# Patient Record
Sex: Female | Born: 1955 | Race: White | Hispanic: No | Marital: Married | Smoking: Never smoker
Health system: Southern US, Community
[De-identification: ages and names within clinical notes are randomized; demographics above are authoritative.]

## PROBLEM LIST (undated history)

## (undated) DIAGNOSIS — R112 Nausea with vomiting, unspecified: Secondary | ICD-10-CM

## (undated) DIAGNOSIS — T8859XA Other complications of anesthesia, initial encounter: Secondary | ICD-10-CM

## (undated) DIAGNOSIS — M8580 Other specified disorders of bone density and structure, unspecified site: Secondary | ICD-10-CM

## (undated) DIAGNOSIS — E7800 Pure hypercholesterolemia, unspecified: Secondary | ICD-10-CM

## (undated) DIAGNOSIS — M810 Age-related osteoporosis without current pathological fracture: Secondary | ICD-10-CM

## (undated) DIAGNOSIS — N390 Urinary tract infection, site not specified: Secondary | ICD-10-CM

## (undated) DIAGNOSIS — E559 Vitamin D deficiency, unspecified: Secondary | ICD-10-CM

## (undated) DIAGNOSIS — K635 Polyp of colon: Secondary | ICD-10-CM

## (undated) DIAGNOSIS — Z9889 Other specified postprocedural states: Secondary | ICD-10-CM

## (undated) DIAGNOSIS — N3010 Interstitial cystitis (chronic) without hematuria: Secondary | ICD-10-CM

## (undated) DIAGNOSIS — E538 Deficiency of other specified B group vitamins: Secondary | ICD-10-CM

## (undated) HISTORY — PX: TUBAL LIGATION: SHX77

## (undated) HISTORY — PX: OOPHORECTOMY: SHX86

## (undated) HISTORY — PX: ABDOMINAL HYSTERECTOMY: SHX81

---

## 2004-09-03 ENCOUNTER — Ambulatory Visit: Payer: Self-pay | Admitting: Unknown Physician Specialty

## 2005-06-09 ENCOUNTER — Ambulatory Visit: Payer: Self-pay | Admitting: Urology

## 2005-06-21 ENCOUNTER — Ambulatory Visit: Payer: Self-pay | Admitting: Urology

## 2005-09-10 ENCOUNTER — Ambulatory Visit: Payer: Self-pay | Admitting: Unknown Physician Specialty

## 2005-09-17 ENCOUNTER — Ambulatory Visit: Payer: Self-pay | Admitting: Unknown Physician Specialty

## 2006-02-10 ENCOUNTER — Ambulatory Visit: Payer: Self-pay | Admitting: Unknown Physician Specialty

## 2006-07-11 ENCOUNTER — Ambulatory Visit: Payer: Self-pay | Admitting: Unknown Physician Specialty

## 2006-09-13 ENCOUNTER — Ambulatory Visit: Payer: Self-pay | Admitting: Unknown Physician Specialty

## 2007-08-04 ENCOUNTER — Ambulatory Visit: Payer: Self-pay | Admitting: Unknown Physician Specialty

## 2007-08-08 ENCOUNTER — Inpatient Hospital Stay: Payer: Self-pay | Admitting: Unknown Physician Specialty

## 2007-10-11 ENCOUNTER — Ambulatory Visit: Payer: Self-pay | Admitting: Unknown Physician Specialty

## 2008-10-21 ENCOUNTER — Ambulatory Visit: Payer: Self-pay | Admitting: Unknown Physician Specialty

## 2009-10-22 ENCOUNTER — Ambulatory Visit: Payer: Self-pay | Admitting: Unknown Physician Specialty

## 2010-01-12 ENCOUNTER — Ambulatory Visit: Payer: Self-pay | Admitting: Unknown Physician Specialty

## 2010-11-13 ENCOUNTER — Ambulatory Visit: Payer: Self-pay | Admitting: Unknown Physician Specialty

## 2016-03-16 ENCOUNTER — Other Ambulatory Visit: Payer: Self-pay | Admitting: Obstetrics & Gynecology

## 2016-03-16 DIAGNOSIS — M810 Age-related osteoporosis without current pathological fracture: Secondary | ICD-10-CM | POA: Insufficient documentation

## 2016-03-16 DIAGNOSIS — Z1231 Encounter for screening mammogram for malignant neoplasm of breast: Secondary | ICD-10-CM

## 2016-03-23 ENCOUNTER — Encounter: Payer: Self-pay | Admitting: Radiology

## 2016-03-23 ENCOUNTER — Ambulatory Visit
Admission: RE | Admit: 2016-03-23 | Discharge: 2016-03-23 | Disposition: A | Discharge status: Home / Self Care | Payer: 59 | Source: Ambulatory Visit | Attending: Obstetrics & Gynecology | Admitting: Obstetrics & Gynecology

## 2016-03-23 DIAGNOSIS — Z1231 Encounter for screening mammogram for malignant neoplasm of breast: Secondary | ICD-10-CM | POA: Diagnosis not present

## 2017-03-17 ENCOUNTER — Other Ambulatory Visit: Payer: Self-pay | Admitting: Obstetrics & Gynecology

## 2017-03-17 DIAGNOSIS — Z1231 Encounter for screening mammogram for malignant neoplasm of breast: Secondary | ICD-10-CM

## 2017-03-30 ENCOUNTER — Ambulatory Visit
Admission: RE | Admit: 2017-03-30 | Discharge: 2017-03-30 | Disposition: A | Discharge status: Home / Self Care | Payer: 59 | Source: Ambulatory Visit | Attending: Obstetrics & Gynecology | Admitting: Obstetrics & Gynecology

## 2017-03-30 DIAGNOSIS — Z1231 Encounter for screening mammogram for malignant neoplasm of breast: Secondary | ICD-10-CM

## 2018-03-21 ENCOUNTER — Other Ambulatory Visit: Payer: Self-pay | Admitting: Obstetrics & Gynecology

## 2018-03-21 DIAGNOSIS — Z1231 Encounter for screening mammogram for malignant neoplasm of breast: Secondary | ICD-10-CM

## 2018-04-27 ENCOUNTER — Ambulatory Visit
Admission: RE | Admit: 2018-04-27 | Discharge: 2018-04-27 | Disposition: A | Discharge status: Home / Self Care | Payer: BLUE CROSS/BLUE SHIELD | Source: Ambulatory Visit | Attending: Obstetrics & Gynecology | Admitting: Obstetrics & Gynecology

## 2018-04-27 DIAGNOSIS — Z1231 Encounter for screening mammogram for malignant neoplasm of breast: Secondary | ICD-10-CM | POA: Insufficient documentation

## 2019-03-28 ENCOUNTER — Other Ambulatory Visit: Payer: Self-pay | Admitting: Obstetrics & Gynecology

## 2019-03-28 DIAGNOSIS — Z1231 Encounter for screening mammogram for malignant neoplasm of breast: Secondary | ICD-10-CM

## 2019-04-30 ENCOUNTER — Ambulatory Visit
Admission: RE | Admit: 2019-04-30 | Discharge: 2019-04-30 | Disposition: A | Discharge status: Home / Self Care | Payer: BC Managed Care – PPO | Source: Ambulatory Visit | Attending: Obstetrics & Gynecology | Admitting: Obstetrics & Gynecology

## 2019-04-30 DIAGNOSIS — Z1231 Encounter for screening mammogram for malignant neoplasm of breast: Secondary | ICD-10-CM | POA: Insufficient documentation

## 2019-05-07 ENCOUNTER — Other Ambulatory Visit: Payer: Self-pay | Admitting: Obstetrics & Gynecology

## 2019-05-07 DIAGNOSIS — N6310 Unspecified lump in the right breast, unspecified quadrant: Secondary | ICD-10-CM

## 2019-05-07 DIAGNOSIS — R928 Other abnormal and inconclusive findings on diagnostic imaging of breast: Secondary | ICD-10-CM

## 2019-05-17 ENCOUNTER — Ambulatory Visit
Admission: RE | Admit: 2019-05-17 | Discharge: 2019-05-17 | Disposition: A | Discharge status: Home / Self Care | Payer: BC Managed Care – PPO | Source: Ambulatory Visit | Attending: Obstetrics & Gynecology | Admitting: Obstetrics & Gynecology

## 2019-05-17 DIAGNOSIS — R928 Other abnormal and inconclusive findings on diagnostic imaging of breast: Secondary | ICD-10-CM | POA: Diagnosis not present

## 2019-05-17 DIAGNOSIS — N6310 Unspecified lump in the right breast, unspecified quadrant: Secondary | ICD-10-CM

## 2019-05-21 ENCOUNTER — Ambulatory Visit: Payer: BC Managed Care – PPO

## 2019-05-21 ENCOUNTER — Other Ambulatory Visit: Payer: BC Managed Care – PPO

## 2019-11-05 ENCOUNTER — Other Ambulatory Visit: Payer: Self-pay | Admitting: Obstetrics & Gynecology

## 2019-11-05 DIAGNOSIS — R928 Other abnormal and inconclusive findings on diagnostic imaging of breast: Secondary | ICD-10-CM

## 2019-11-23 ENCOUNTER — Ambulatory Visit
Admission: RE | Admit: 2019-11-23 | Discharge: 2019-11-23 | Disposition: A | Discharge status: Home / Self Care | Payer: BC Managed Care – PPO | Source: Ambulatory Visit | Attending: Obstetrics & Gynecology | Admitting: Obstetrics & Gynecology

## 2019-11-23 DIAGNOSIS — R928 Other abnormal and inconclusive findings on diagnostic imaging of breast: Secondary | ICD-10-CM

## 2020-04-01 ENCOUNTER — Other Ambulatory Visit: Payer: Self-pay | Admitting: Obstetrics & Gynecology

## 2020-04-01 DIAGNOSIS — N6001 Solitary cyst of right breast: Secondary | ICD-10-CM

## 2020-05-21 ENCOUNTER — Other Ambulatory Visit: Payer: Self-pay

## 2020-05-21 ENCOUNTER — Ambulatory Visit
Admission: RE | Admit: 2020-05-21 | Discharge: 2020-05-21 | Disposition: A | Discharge status: Home / Self Care | Payer: BC Managed Care – PPO | Source: Ambulatory Visit | Attending: Obstetrics & Gynecology | Admitting: Obstetrics & Gynecology

## 2020-05-21 DIAGNOSIS — N6001 Solitary cyst of right breast: Secondary | ICD-10-CM | POA: Diagnosis not present

## 2020-10-20 ENCOUNTER — Encounter: Payer: Self-pay | Admitting: *Deleted

## 2020-10-21 ENCOUNTER — Encounter
Admission: RE | Disposition: A | Discharge status: Home / Self Care | Payer: Self-pay | Source: Home / Self Care | Attending: Gastroenterology

## 2020-10-21 ENCOUNTER — Ambulatory Visit: Payer: BC Managed Care – PPO | Admitting: Anesthesiology

## 2020-10-21 ENCOUNTER — Encounter: Payer: Self-pay | Admitting: *Deleted

## 2020-10-21 ENCOUNTER — Ambulatory Visit
Admission: RE | Admit: 2020-10-21 | Discharge: 2020-10-21 | Disposition: A | Discharge status: Home / Self Care | Payer: BC Managed Care – PPO | Attending: Gastroenterology | Admitting: Gastroenterology

## 2020-10-21 DIAGNOSIS — Z1211 Encounter for screening for malignant neoplasm of colon: Secondary | ICD-10-CM | POA: Insufficient documentation

## 2020-10-21 DIAGNOSIS — D122 Benign neoplasm of ascending colon: Secondary | ICD-10-CM | POA: Diagnosis not present

## 2020-10-21 DIAGNOSIS — Z880 Allergy status to penicillin: Secondary | ICD-10-CM | POA: Diagnosis not present

## 2020-10-21 DIAGNOSIS — D120 Benign neoplasm of cecum: Secondary | ICD-10-CM | POA: Insufficient documentation

## 2020-10-21 DIAGNOSIS — K5730 Diverticulosis of large intestine without perforation or abscess without bleeding: Secondary | ICD-10-CM | POA: Insufficient documentation

## 2020-10-21 HISTORY — DX: Nausea with vomiting, unspecified: Z98.890

## 2020-10-21 HISTORY — DX: Other complications of anesthesia, initial encounter: T88.59XA

## 2020-10-21 HISTORY — DX: Vitamin D deficiency, unspecified: E55.9

## 2020-10-21 HISTORY — DX: Deficiency of other specified B group vitamins: E53.8

## 2020-10-21 HISTORY — DX: Age-related osteoporosis without current pathological fracture: M81.0

## 2020-10-21 HISTORY — DX: Other specified disorders of bone density and structure, unspecified site: M85.80

## 2020-10-21 HISTORY — DX: Nausea with vomiting, unspecified: R11.2

## 2020-10-21 HISTORY — DX: Interstitial cystitis (chronic) without hematuria: N30.10

## 2020-10-21 HISTORY — PX: COLONOSCOPY WITH PROPOFOL: SHX5780

## 2020-10-21 HISTORY — DX: Urinary tract infection, site not specified: N39.0

## 2020-10-21 HISTORY — DX: Pure hypercholesterolemia, unspecified: E78.00

## 2020-10-21 SURGERY — COLONOSCOPY WITH PROPOFOL
Anesthesia: General

## 2020-10-21 MED ORDER — ONDANSETRON HCL 4 MG/2ML IJ SOLN
INTRAMUSCULAR | Status: DC | PRN
  Administered 2020-10-21: 4 mg via INTRAVENOUS

## 2020-10-21 MED ORDER — PROPOFOL 10 MG/ML IV BOLUS
INTRAVENOUS | Status: AC
  Filled 2020-10-21: qty 20

## 2020-10-21 MED ORDER — SODIUM CHLORIDE 0.9 % IV SOLN
INTRAVENOUS | Status: DC
  Administered 2020-10-21: 1000 mL via INTRAVENOUS

## 2020-10-21 MED ORDER — PROPOFOL 500 MG/50ML IV EMUL
INTRAVENOUS | Status: AC
  Filled 2020-10-21: qty 50

## 2020-10-21 MED ORDER — PROPOFOL 500 MG/50ML IV EMUL
INTRAVENOUS | Status: DC | PRN
  Administered 2020-10-21: 125 ug/kg/min via INTRAVENOUS

## 2020-10-21 MED ORDER — PROPOFOL 10 MG/ML IV BOLUS
INTRAVENOUS | Status: DC | PRN
  Administered 2020-10-21: 30 mg via INTRAVENOUS
  Administered 2020-10-21: 20 mg via INTRAVENOUS

## 2020-10-21 MED ORDER — LIDOCAINE HCL (CARDIAC) PF 100 MG/5ML IV SOSY
PREFILLED_SYRINGE | INTRAVENOUS | Status: DC | PRN
  Administered 2020-10-21: 40 mg via INTRAVENOUS

## 2020-10-21 NOTE — H&P (Signed)
Outpatient short stay form Pre-procedure 10/21/2020 7:44 AM Raylene Miyamoto MD, MPH  Primary Physician: [facility]  Reason for visit:  Screening Colon  History of present illness:   65 y/o lady with no significant history here for screening colonoscopy. Had normal colonoscopy in 2011. No family history of GI malignancies. No blood thinners. No abdominal surgeries.    Current Facility-Administered Medications:  .  0.9 %  sodium chloride infusion, , Intravenous, Continuous, Donie Moulton, Hilton Cork, MD, Last Rate: 20 mL/hr at 10/21/20 0740, 1,000 mL at 10/21/20 0740  Medications Prior to Admission  Medication Sig Dispense Refill Last Dose  . CRANBERRY EXTRACT PO Take by mouth daily at 8 pm.   10/20/2020 at Unknown time  . Multiple Vitamin (MULTIVITAMIN) tablet Take 1 tablet by mouth daily.   Past Week at Unknown time     Allergies  Allergen Reactions  . Amoxicillin Hives     Past Medical History:  Diagnosis Date  . Complication of anesthesia   . Hypercholesteremia   . Interstitial cystitis   . Osteopenia   . Osteoporosis   . PONV (postoperative nausea and vomiting)   . Recurrent UTI   . Vitamin B12 deficiency   . Vitamin D deficiency     Review of systems:  Otherwise negative.    Physical Exam  Gen: Alert, oriented. Appears stated age.  HEENT: PERRLA. Lungs: No respiratory distress CV: RRR Abd: soft, benign, no masses Ext: No edema    Planned procedures: Proceed with colonoscopy. The patient understands the nature of the planned procedure, indications, risks, alternatives and potential complications including but not limited to bleeding, infection, perforation, damage to internal organs and possible oversedation/side effects from anesthesia. The patient agrees and gives consent to proceed.  Please refer to procedure notes for findings, recommendations and patient disposition/instructions.     Raylene Miyamoto MD, MPH Gastroenterology 10/21/2020  7:44 AM

## 2020-10-21 NOTE — Anesthesia Postprocedure Evaluation (Signed)
Anesthesia Post Note  Patient: Michele Chen  Procedure(s) Performed: COLONOSCOPY WITH PROPOFOL (N/A )  Patient location during evaluation: Endoscopy Anesthesia Type: General Level of consciousness: awake and alert Pain management: pain level controlled Vital Signs Assessment: post-procedure vital signs reviewed and stable Respiratory status: spontaneous breathing, nonlabored ventilation, respiratory function stable and patient connected to nasal cannula oxygen Cardiovascular status: blood pressure returned to baseline and stable Postop Assessment: no apparent nausea or vomiting Anesthetic complications: no   No complications documented.   Last Vitals:  Vitals:   10/21/20 0850 10/21/20 0900  BP: 109/72 110/66  Pulse: 65 66  Resp: 17 19  Temp:    SpO2: 100% 100%    Last Pain:  Vitals:   10/21/20 0900  TempSrc:   PainSc: 0-No pain                 Arita Miss

## 2020-10-21 NOTE — Op Note (Signed)
[facility] Patient Name: Michele Chen Procedure Date: 10/21/2020 7:32 AM MRN: [id number] Account #: [id number] Date of Birth: 04-Jul-1955 Admit Type: Outpatient Age: 65 Room: [facility] ENDO ROOM 2 Gender: Female Note Status: Finalized Procedure:             Colonoscopy Indications:           Screening for colorectal malignant neoplasm Providers:             Andrey Farmer MD, MD Referring MD:          Honor Loh. Ward (Referring MD) Medicines:             Monitored Anesthesia Care Complications:         No immediate complications. Estimated blood loss:                         Minimal. Procedure:             Pre-Anesthesia Assessment:                        - Prior to the procedure, a History and Physical was                         performed, and patient medications and allergies were                         reviewed. The patient is competent. The risks and                         benefits of the procedure and the sedation options and                         risks were discussed with the patient. All questions                         were answered and informed consent was obtained.                         Patient identification and proposed procedure were                         verified by the physician, the nurse, the anesthetist                         and the technician in the endoscopy suite. Mental                         Status Examination: alert and oriented. Airway                         Examination: normal oropharyngeal airway and neck                         mobility. Respiratory Examination: clear to                         auscultation. CV Examination: normal. Prophylactic  Antibiotics: The patient does not require prophylactic                         antibiotics. Prior Anticoagulants: The patient has                         taken no previous anticoagulant or antiplatelet                         agents. ASA Grade  Assessment: I - A normal, healthy                         patient. After reviewing the risks and benefits, the                         patient was deemed in satisfactory condition to                         undergo the procedure. The anesthesia plan was to use                         monitored anesthesia care (MAC). Immediately prior to                         administration of medications, the patient was                         re-assessed for adequacy to receive sedatives. The                         heart rate, respiratory rate, oxygen saturations,                         blood pressure, adequacy of pulmonary ventilation, and                         response to care were monitored throughout the                         procedure. The physical status of the patient was                         re-assessed after the procedure.                        After obtaining informed consent, the colonoscope was                         passed under direct vision. Throughout the procedure,                         the patient's blood pressure, pulse, and oxygen                         saturations were monitored continuously. The                         Colonoscope was introduced through the anus and  advanced to the the cecum, identified by appendiceal                         orifice and ileocecal valve. The colonoscopy was                         technically difficult and complex due to restricted                         mobility of the colon. Successful completion of the                         procedure was aided by withdrawing the scope and                         replacing with the pediatric colonoscope. The patient                         tolerated the procedure well. The quality of the bowel                         preparation was good. Findings:      The perianal and digital rectal examinations were normal.      A 2 mm polyp was found in the cecum. The polyp was  sessile. The polyp       was removed with a jumbo cold forceps. Resection and retrieval were       complete. Estimated blood loss was minimal.      A few hyperplastic polyps were found in the cecum. The polyps were less       than 1 mm in size. Two polyps were removed with a jumbo cold forceps.       Resection and retrieval were complete. Estimated blood loss was minimal.      A 2 mm polyp was found in the ascending colon. The polyp was sessile.       The polyp was removed with a jumbo cold forceps. Resection and retrieval       were complete. Estimated blood loss was minimal.      Multiple small-mouthed diverticula were found in the sigmoid colon.      The exam was otherwise without abnormality on direct and retroflexion       views. Impression:            - One 2 mm polyp in the cecum, removed with a jumbo                         cold forceps. Resected and retrieved.                        - A few less than 1 mm polyps in the cecum, removed                         with a jumbo cold forceps. Resected and retrieved.                        - One 2 mm polyp in the ascending colon, removed with  a jumbo cold forceps. Resected and retrieved.                        - Diverticulosis in the sigmoid colon.                        - The examination was otherwise normal on direct and                         retroflexion views. Recommendation:        - Discharge patient to home.                        - Resume previous diet.                        - Continue present medications.                        - Await pathology results.                        - Repeat colonoscopy for surveillance based on                         pathology results.                        - Return to referring physician as previously                         scheduled. Procedure Code(s):     --- Professional ---                        [phone], Colonoscopy, flexible; with biopsy, single or                          multiple Diagnosis Code(s):     --- Professional ---                        K63.5, Polyp of colon                        Z12.11, Encounter for screening for malignant neoplasm                         of colon                        K57.30, Diverticulosis of large intestine without                         perforation or abscess without bleeding CPT copyright 2019 [employer]. All rights reserved. The codes documented in this report are preliminary and upon coder review may  be revised to meet current compliance requirements. Andrey Farmer MD, MD 10/21/2020 8:33:04 AM Number of Addenda: 0 Note Initiated On: 10/21/2020 7:32 AM Estimated Blood Loss:  Estimated blood loss was minimal.      [facility]

## 2020-10-21 NOTE — Anesthesia Preprocedure Evaluation (Signed)
Anesthesia Evaluation  Patient identified by MRN, date of birth, ID band Patient awake    Reviewed: Allergy & Precautions, NPO status , Patient's Chart, lab work & pertinent test results  History of Anesthesia Complications (+) PONV and history of anesthetic complications  Airway Mallampati: II  TM Distance: >3 FB Neck ROM: Full    Dental no notable dental hx. (+) Teeth Intact   Pulmonary neg pulmonary ROS, neg sleep apnea, neg COPD, Patient abstained from smoking.Not current smoker,    Pulmonary exam normal breath sounds clear to auscultation       Cardiovascular Exercise Tolerance: Good METS(-) hypertension(-) CAD and (-) Past MI negative cardio ROS  (-) dysrhythmias  Rhythm:Regular Rate:Normal - Systolic murmurs    Neuro/Psych negative neurological ROS  negative psych ROS   GI/Hepatic neg GERD  ,(+)     (-) substance abuse  ,   Endo/Other  neg diabetes  Renal/GU negative Renal ROS     Musculoskeletal   Abdominal   Peds  Hematology   Anesthesia Other Findings Past Medical History: No date: Complication of anesthesia No date: Hypercholesteremia No date: Interstitial cystitis No date: Osteopenia No date: Osteoporosis No date: PONV (postoperative nausea and vomiting) No date: Recurrent UTI No date: Vitamin B12 deficiency No date: Vitamin D deficiency  Reproductive/Obstetrics                             Anesthesia Physical Anesthesia Plan  ASA: I  Anesthesia Plan: General   Post-op Pain Management:    Induction: Intravenous  PONV Risk Score and Plan: 4 or greater and Ondansetron, Propofol infusion and TIVA  Airway Management Planned: Nasal Cannula  Additional Equipment: None  Intra-op Plan:   Post-operative Plan:   Informed Consent: I have reviewed the patients History and Physical, chart, labs and discussed the procedure including the risks, benefits and  alternatives for the proposed anesthesia with the patient or authorized representative who has indicated his/her understanding and acceptance.     Dental advisory given  Plan Discussed with: CRNA and Surgeon  Anesthesia Plan Comments: (Discussed risks of anesthesia with patient, including possibility of difficulty with spontaneous ventilation under anesthesia necessitating airway intervention, PONV, and rare risks such as cardiac or respiratory or neurological events. Patient understands.)        Anesthesia Quick Evaluation

## 2020-10-21 NOTE — Transfer of Care (Signed)
Immediate Anesthesia Transfer of Care Note  Patient: Michele Chen  Procedure(s) Performed: COLONOSCOPY WITH PROPOFOL (N/A )  Patient Location: PACU  Anesthesia Type:General  Level of Consciousness: awake, alert  and oriented  Airway & Oxygen Therapy: Patient Spontanous Breathing  Post-op Assessment: Report given to RN and Post -op Vital signs reviewed and stable  Post vital signs: Reviewed and stable  Last Vitals:  Vitals Value Taken Time  BP 100/69 10/21/20 0830  Temp    Pulse 65 10/21/20 0830  Resp 17 10/21/20 0830  SpO2 100 % 10/21/20 0830  Vitals shown include unvalidated device data.  Last Pain:  Vitals:   10/21/20 0719  TempSrc: Temporal  PainSc: 0-No pain         Complications: No complications documented.

## 2020-10-21 NOTE — Interval H&P Note (Signed)
History and Physical Interval Note:  10/21/2020 7:47 AM  Michele Chen  has presented today for surgery, with the diagnosis of screening.  The various methods of treatment have been discussed with the patient and family. After consideration of risks, benefits and other options for treatment, the patient has consented to  Procedure(s): COLONOSCOPY WITH PROPOFOL (N/A) as a surgical intervention.  The patient's history has been reviewed, patient examined, no change in status, stable for surgery.  I have reviewed the patient's chart and labs.  Questions were answered to the patient's satisfaction.     [name]  Ok to proceed with colonoscopy

## 2020-10-22 ENCOUNTER — Encounter: Payer: Self-pay | Admitting: Gastroenterology

## 2020-10-22 LAB — SURGICAL PATHOLOGY

## 2021-04-02 DIAGNOSIS — K5732 Diverticulitis of large intestine without perforation or abscess without bleeding: Secondary | ICD-10-CM | POA: Insufficient documentation

## 2021-04-02 DIAGNOSIS — Z86010 Personal history of colonic polyps: Secondary | ICD-10-CM | POA: Insufficient documentation

## 2021-04-02 DIAGNOSIS — E782 Mixed hyperlipidemia: Secondary | ICD-10-CM | POA: Insufficient documentation

## 2021-04-13 ENCOUNTER — Other Ambulatory Visit: Payer: Self-pay | Admitting: Student

## 2021-04-13 DIAGNOSIS — Z1231 Encounter for screening mammogram for malignant neoplasm of breast: Secondary | ICD-10-CM

## 2021-04-20 ENCOUNTER — Ambulatory Visit
Admission: RE | Admit: 2021-04-20 | Discharge: 2021-04-20 | Disposition: A | Discharge status: Home / Self Care | Payer: BC Managed Care – PPO | Source: Ambulatory Visit | Attending: Gastroenterology | Admitting: Gastroenterology

## 2021-04-20 ENCOUNTER — Encounter: Payer: Self-pay | Admitting: *Deleted

## 2021-04-20 ENCOUNTER — Other Ambulatory Visit: Payer: Self-pay

## 2021-04-20 ENCOUNTER — Encounter
Admission: RE | Disposition: A | Discharge status: Home / Self Care | Payer: Self-pay | Source: Ambulatory Visit | Attending: Gastroenterology

## 2021-04-20 ENCOUNTER — Ambulatory Visit: Payer: BC Managed Care – PPO | Admitting: Anesthesiology

## 2021-04-20 DIAGNOSIS — K5730 Diverticulosis of large intestine without perforation or abscess without bleeding: Secondary | ICD-10-CM | POA: Insufficient documentation

## 2021-04-20 DIAGNOSIS — Z86010 Personal history of colonic polyps: Secondary | ICD-10-CM | POA: Insufficient documentation

## 2021-04-20 DIAGNOSIS — D120 Benign neoplasm of cecum: Secondary | ICD-10-CM | POA: Diagnosis present

## 2021-04-20 HISTORY — DX: Polyp of colon: K63.5

## 2021-04-20 HISTORY — PX: COLONOSCOPY WITH PROPOFOL: SHX5780

## 2021-04-20 SURGERY — COLONOSCOPY WITH PROPOFOL
Anesthesia: General

## 2021-04-20 MED ORDER — PROPOFOL 10 MG/ML IV BOLUS
INTRAVENOUS | Status: DC | PRN
  Administered 2021-04-20: 10 mg via INTRAVENOUS
  Administered 2021-04-20: 20 mg via INTRAVENOUS
  Administered 2021-04-20 (×2): 10 mg via INTRAVENOUS

## 2021-04-20 MED ORDER — LIDOCAINE HCL (CARDIAC) PF 100 MG/5ML IV SOSY
PREFILLED_SYRINGE | INTRAVENOUS | Status: DC | PRN
  Administered 2021-04-20: 50 mg via INTRAVENOUS

## 2021-04-20 MED ORDER — SODIUM CHLORIDE 0.9 % IV SOLN: INTRAVENOUS | Status: DC

## 2021-04-20 MED ORDER — FENTANYL CITRATE (PF) 100 MCG/2ML IJ SOLN
INTRAMUSCULAR | Status: AC
  Filled 2021-04-20: qty 2

## 2021-04-20 MED ORDER — MIDAZOLAM HCL 2 MG/2ML IJ SOLN
INTRAMUSCULAR | Status: DC | PRN
  Administered 2021-04-20: 2 mg via INTRAVENOUS

## 2021-04-20 MED ORDER — FENTANYL CITRATE (PF) 100 MCG/2ML IJ SOLN
INTRAMUSCULAR | Status: DC | PRN
  Administered 2021-04-20 (×4): 25 ug via INTRAVENOUS

## 2021-04-20 MED ORDER — LIDOCAINE HCL (PF) 2 % IJ SOLN
INTRAMUSCULAR | Status: AC
  Filled 2021-04-20: qty 10

## 2021-04-20 MED ORDER — PROPOFOL 500 MG/50ML IV EMUL
INTRAVENOUS | Status: DC | PRN
  Administered 2021-04-20: 50 ug/kg/min via INTRAVENOUS

## 2021-04-20 MED ORDER — PROPOFOL 500 MG/50ML IV EMUL
INTRAVENOUS | Status: AC
  Filled 2021-04-20: qty 50

## 2021-04-20 MED ORDER — MIDAZOLAM HCL 2 MG/2ML IJ SOLN
INTRAMUSCULAR | Status: AC
  Filled 2021-04-20: qty 2

## 2021-04-20 NOTE — Anesthesia Preprocedure Evaluation (Signed)
Anesthesia Evaluation  Patient identified by MRN, date of birth, ID band Patient awake    Reviewed: Allergy & Precautions, H&P , NPO status , Patient's Chart, lab work & pertinent test results, reviewed documented beta blocker date and time   History of Anesthesia Complications (+) PONV and history of anesthetic complications  Airway Mallampati: II   Neck ROM: full    Dental  (+) Poor Dentition   Pulmonary neg pulmonary ROS,    Pulmonary exam normal        Cardiovascular Exercise Tolerance: Good negative cardio ROS Normal cardiovascular exam Rhythm:regular Rate:Normal     Neuro/Psych negative neurological ROS  negative psych ROS   GI/Hepatic negative GI ROS, Neg liver ROS,   Endo/Other  negative endocrine ROS  Renal/GU negative Renal ROS  negative genitourinary   Musculoskeletal   Abdominal   Peds  Hematology negative hematology ROS (+)   Anesthesia Other Findings Past Medical History: No date: Colon polyps No date: Complication of anesthesia No date: Hypercholesteremia No date: Interstitial cystitis No date: Osteopenia No date: Osteoporosis No date: Osteoporosis No date: PONV (postoperative nausea and vomiting) No date: Recurrent UTI No date: Vitamin B12 deficiency No date: Vitamin D deficiency Past Surgical History: No date: ABDOMINAL HYSTERECTOMY No date: CESAREAN SECTION 10/21/2020: COLONOSCOPY WITH PROPOFOL; N/A     Comment:  Procedure: COLONOSCOPY WITH PROPOFOL;  Surgeon:               [name], MD;  Location: [facility] ENDOSCOPY;                Service: Endoscopy;  Laterality: N/A; No date: OOPHORECTOMY No date: TUBAL LIGATION BMI    Body Mass Index: 23.52 kg/m     Reproductive/Obstetrics negative OB ROS                             Anesthesia Physical Anesthesia Plan  ASA: 2  Anesthesia Plan: General   Post-op Pain Management:    Induction:   PONV  Risk Score and Plan:   Airway Management Planned:   Additional Equipment:   Intra-op Plan:   Post-operative Plan:   Informed Consent: I have reviewed the patients History and Physical, chart, labs and discussed the procedure including the risks, benefits and alternatives for the proposed anesthesia with the patient or authorized representative who has indicated his/her understanding and acceptance.     Dental Advisory Given  Plan Discussed with: CRNA  Anesthesia Plan Comments:         Anesthesia Quick Evaluation

## 2021-04-20 NOTE — H&P (Signed)
Outpatient short stay form Pre-procedure 04/20/2021  [name], MD  Primary Physician: Ward, Honor Loh, MD  Reason for visit:  Incomplete resection of adenomatous polyp  History of present illness:   65 y/o lady who had colonoscopy about 6 months ago with granular mucosa in cecum that turned out to be adenomatous polyp. She is here for removal of this mucosa. No family history of GI malignancies. No blood thinners. No abdominal surgeries.    Current Facility-Administered Medications:    0.9 %  sodium chloride infusion, , Intravenous, Continuous, Daje Stark, Hilton Cork, MD, Last Rate: 20 mL/hr at 04/20/21 1048, New Bag at 04/20/21 1048  Medications Prior to Admission  Medication Sig Dispense Refill Last Dose   atorvastatin (LIPITOR) 10 MG tablet Take 10 mg by mouth daily.   04/19/2021   CRANBERRY EXTRACT PO Take by mouth daily at 8 pm.   04/18/2021   Multiple Vitamin (MULTIVITAMIN) tablet Take 1 tablet by mouth daily.   04/18/2021     Allergies  Allergen Reactions   Amoxicillin Hives     Past Medical History:  Diagnosis Date   Colon polyps    Complication of anesthesia    Hypercholesteremia    Interstitial cystitis    Osteopenia    Osteoporosis    Osteoporosis    PONV (postoperative nausea and vomiting)    Recurrent UTI    Vitamin B12 deficiency    Vitamin D deficiency     Review of systems:  Otherwise negative.    Physical Exam  Gen: Alert, oriented. Appears stated age.  HEENT: PERRLA. Lungs: No respiratory distress CV: RRR Abd: soft, benign, no masses Ext: No edema    Planned procedures: Proceed with colonoscopy. The patient understands the nature of the planned procedure, indications, risks, alternatives and potential complications including but not limited to bleeding, infection, perforation, damage to internal organs and possible oversedation/side effects from anesthesia. The patient agrees and gives consent to proceed.  Please refer to procedure  notes for findings, recommendations and patient disposition/instructions.     [name], MD [facility] Gastroenterology

## 2021-04-20 NOTE — Interval H&P Note (Signed)
History and Physical Interval Note:  04/20/2021 11:00 AM  Michele Chen  has presented today for surgery, with the diagnosis of SCREENING.  The various methods of treatment have been discussed with the patient and family. After consideration of risks, benefits and other options for treatment, the patient has consented to  Procedure(s): COLONOSCOPY WITH PROPOFOL (N/A) as a surgical intervention.  The patient's history has been reviewed, patient examined, no change in status, stable for surgery.  I have reviewed the patient's chart and labs.  Questions were answered to the patient's satisfaction.     [name]  Ok to proceed with colonoscopy

## 2021-04-20 NOTE — Anesthesia Postprocedure Evaluation (Signed)
Anesthesia Post Note  Patient: Michele Chen  Procedure(s) Performed: COLONOSCOPY WITH PROPOFOL  Patient location during evaluation: PACU Anesthesia Type: General Level of consciousness: awake and alert Pain management: pain level controlled Vital Signs Assessment: post-procedure vital signs reviewed and stable Respiratory status: spontaneous breathing, nonlabored ventilation, respiratory function stable and patient connected to nasal cannula oxygen Cardiovascular status: blood pressure returned to baseline and stable Postop Assessment: no apparent nausea or vomiting Anesthetic complications: no   No notable events documented.   Last Vitals:  Vitals:   04/20/21 1038 04/20/21 1130  BP: (!) 118/92 106/62  Pulse: (!) 51 79  Resp: 18 19  Temp: 36.9 C   SpO2: 100% 98%    Last Pain:  Vitals:   04/20/21 1130  TempSrc:   PainSc: [place] Shetara Launer

## 2021-04-20 NOTE — Op Note (Signed)
[facility] Gastroenterology Patient Name: Michele Chen Procedure Date: 04/20/2021 11:01 AM MRN: [id number] Account #: [id number] Date of Birth: [birth year] Admit Type: Outpatient Age: 65 Room: [facility] ENDO ROOM 3 Gender: Female Note Status: Finalized Instrument Name: Peds Colonoscope [id number] Procedure:             Colonoscopy Indications:           Surveillance: Personal history of piecemeal removal of                         adenoma on last colonoscopy 6 months ago Providers:             Andrey Farmer MD, MD Referring MD:          Honor Loh. Ward (Referring MD) Medicines:             Monitored Anesthesia Care Complications:         No immediate complications. Estimated blood loss:                         Minimal. Procedure:             Pre-Anesthesia Assessment:                        - Prior to the procedure, a History and Physical was                         performed, and patient medications and allergies were                         reviewed. The patient is competent. The risks and                         benefits of the procedure and the sedation options and                         risks were discussed with the patient. All questions                         were answered and informed consent was obtained.                         Patient identification and proposed procedure were                         verified by the physician, the nurse, the anesthetist                         and the technician in the endoscopy suite. Mental                         Status Examination: alert and oriented. Airway                         Examination: normal oropharyngeal airway and neck                         mobility. Respiratory Examination: clear to  auscultation. CV Examination: normal. Prophylactic                         Antibiotics: The patient does not require prophylactic                         antibiotics. Prior Anticoagulants: The  patient has                         taken no previous anticoagulant or antiplatelet                         agents. ASA Grade Assessment: II - A patient with mild                         systemic disease. After reviewing the risks and                         benefits, the patient was deemed in satisfactory                         condition to undergo the procedure. The anesthesia                         plan was to use monitored anesthesia care (MAC).                         Immediately prior to administration of medications,                         the patient was re-assessed for adequacy to receive                         sedatives. The heart rate, respiratory rate, oxygen                         saturations, blood pressure, adequacy of pulmonary                         ventilation, and response to care were monitored                         throughout the procedure. The physical status of the                         patient was re-assessed after the procedure.                        After obtaining informed consent, the colonoscope was                         passed under direct vision. Throughout the procedure,                         the patient's blood pressure, pulse, and oxygen                         saturations were monitored continuously. The  Colonoscope was introduced through the anus and                         advanced to the the cecum, identified by appendiceal                         orifice and ileocecal valve. The colonoscopy was                         somewhat difficult due to restricted mobility of the                         colon. The patient tolerated the procedure well. The                         quality of the bowel preparation was good. Findings:      The perianal and digital rectal examinations were normal.      A few sessile polyps were found in the cecum. The polyps were less than       1 mm in size. These polyps were removed with a  jumbo cold forceps.       Resection and retrieval were complete. Estimated blood loss was minimal.      Multiple small-mouthed diverticula were found in the sigmoid colon and       hepatic flexure.      The exam was otherwise without abnormality on direct and retroflexion       views. Impression:            - A few less than 1 mm polyps in the cecum, removed                         with a jumbo cold forceps. Resected and retrieved.                        - Diverticulosis in the sigmoid colon and at the                         hepatic flexure.                        - The examination was otherwise normal on direct and                         retroflexion views. Recommendation:        - Discharge patient to home.                        - Resume previous diet.                        - Continue present medications.                        - Await pathology results.                        - Repeat colonoscopy in 3 years for surveillance.  Recommend to use pediatric scope with all                         colonoscopies.                        - Return to referring physician as previously                         scheduled. Procedure Code(s):     --- Professional ---                        [phone], Colonoscopy, flexible; with biopsy, single or                         multiple Diagnosis Code(s):     --- Professional ---                        K63.5, Polyp of colon                        Z09, Encounter for follow-up examination after                         completed treatment for conditions other than                         malignant neoplasm                        Z86.010, Personal history of colonic polyps                        K57.30, Diverticulosis of large intestine without                         perforation or abscess without bleeding CPT copyright 2019 American Medical Association. All rights reserved. The codes documented in this report are preliminary and upon  coder review may  be revised to meet current compliance requirements. Andrey Farmer MD, MD 04/20/2021 11:35:33 AM Number of Addenda: 0 Note Initiated On: 04/20/2021 11:01 AM Scope Withdrawal Time: 0 hours 10 minutes 24 seconds  Total Procedure Duration: 0 hours 19 minutes 13 seconds  Estimated Blood Loss:  Estimated blood loss was minimal.      [facility]

## 2021-04-20 NOTE — Transfer of Care (Signed)
Immediate Anesthesia Transfer of Care Note  Patient: Michele Chen  Procedure(s) Performed: COLONOSCOPY WITH PROPOFOL  Patient Location: PACU  Anesthesia Type:General  Level of Consciousness: sedated  Airway & Oxygen Therapy: Patient Spontanous Breathing and Patient connected to nasal cannula oxygen  Post-op Assessment: Report given to RN and Post -op Vital signs reviewed and stable  Post vital signs: Reviewed and stable  Last Vitals:  Vitals Value Taken Time  BP 106/62 04/20/21 1130  Temp    Pulse 79 04/20/21 1130  Resp 19 04/20/21 1130  SpO2 98 % 04/20/21 1130    Last Pain:  Vitals:   04/20/21 1130  TempSrc:   PainSc: Asleep         Complications: No notable events documented.

## 2021-04-21 ENCOUNTER — Encounter: Payer: Self-pay | Admitting: Gastroenterology

## 2021-04-21 LAB — SURGICAL PATHOLOGY

## 2021-05-22 ENCOUNTER — Other Ambulatory Visit: Payer: Self-pay

## 2021-05-22 ENCOUNTER — Ambulatory Visit
Admission: RE | Admit: 2021-05-22 | Discharge: 2021-05-22 | Disposition: A | Discharge status: Home / Self Care | Payer: BC Managed Care – PPO | Source: Ambulatory Visit | Attending: Student | Admitting: Student

## 2021-05-22 DIAGNOSIS — Z1231 Encounter for screening mammogram for malignant neoplasm of breast: Secondary | ICD-10-CM | POA: Insufficient documentation

## 2021-05-27 ENCOUNTER — Other Ambulatory Visit: Payer: Self-pay | Admitting: Student

## 2021-05-27 DIAGNOSIS — R928 Other abnormal and inconclusive findings on diagnostic imaging of breast: Secondary | ICD-10-CM

## 2021-06-01 ENCOUNTER — Other Ambulatory Visit: Payer: Self-pay

## 2021-06-01 ENCOUNTER — Ambulatory Visit
Admission: RE | Admit: 2021-06-01 | Discharge: 2021-06-01 | Disposition: A | Discharge status: Home / Self Care | Payer: BC Managed Care – PPO | Source: Ambulatory Visit | Attending: Student | Admitting: Student

## 2021-06-01 DIAGNOSIS — R928 Other abnormal and inconclusive findings on diagnostic imaging of breast: Secondary | ICD-10-CM | POA: Diagnosis present

## 2021-06-03 ENCOUNTER — Other Ambulatory Visit: Payer: Self-pay | Admitting: Student

## 2021-06-03 DIAGNOSIS — N6320 Unspecified lump in the left breast, unspecified quadrant: Secondary | ICD-10-CM

## 2021-11-30 ENCOUNTER — Ambulatory Visit
Admission: RE | Admit: 2021-11-30 | Discharge: 2021-11-30 | Disposition: A | Discharge status: Home / Self Care | Payer: BC Managed Care – PPO | Source: Ambulatory Visit | Attending: Student | Admitting: Student

## 2021-11-30 DIAGNOSIS — N6320 Unspecified lump in the left breast, unspecified quadrant: Secondary | ICD-10-CM | POA: Insufficient documentation

## 2022-04-05 DIAGNOSIS — R7303 Prediabetes: Secondary | ICD-10-CM | POA: Insufficient documentation

## 2022-06-18 ENCOUNTER — Other Ambulatory Visit: Payer: Self-pay | Admitting: Student

## 2022-06-18 DIAGNOSIS — N6002 Solitary cyst of left breast: Secondary | ICD-10-CM

## 2022-06-25 ENCOUNTER — Ambulatory Visit
Admission: RE | Admit: 2022-06-25 | Discharge: 2022-06-25 | Disposition: A | Discharge status: Home / Self Care | Payer: Medicare Other | Source: Ambulatory Visit | Attending: Student | Admitting: Student

## 2022-06-25 DIAGNOSIS — N6002 Solitary cyst of left breast: Secondary | ICD-10-CM | POA: Insufficient documentation

## 2022-12-21 ENCOUNTER — Telehealth: Payer: Self-pay

## 2022-12-21 NOTE — Telephone Encounter (Signed)
Patient confirmed no cardiac history.  Directions to the office provided.

## 2022-12-29 NOTE — Progress Notes (Signed)
Cardiology Office Note  Date:  12/31/2022   ID:  KATALEA RAJCHEL, DOB [birth year], MRN [id number]  PCP:  Carren Rang, PA-C   Chief Complaint  Patient presents with   New Patient (Initial Visit)    Self referral for cardiac evaluation of intermittent rapid heart rate.  Episodes have lessoned since scheduling this appointment.    HPI:  Ms. Tanveer Bergstrand is a 67 year old woman with past medical history of Hyperlipidemia Who presents by referral from Carren Rang for tachycardia, hyperlipidemia  On discussion today, Ms. Minassian reports that symptoms of palpitations started in May 2024, around that time her Sister in law passed away  Was a stressful time Would appreciate fluttering, strong beats Over the past several months symptoms have improved though occasionally will continue to have strong beats, ectopy  Denies significant shortness of breath or chest pain on exertion Good activity tolerance  In terms of family history, Mother with Afib No family members with premature coronary disease and MI  EKG personally reviewed by myself on todays visit EKG Interpretation Date/Time:  Friday December 31 2022 15:52:18 EDT Ventricular Rate:  77 PR Interval:  140 QRS Duration:  92 QT Interval:  382 QTC Calculation: 432 R Axis:   -35  Text Interpretation: Normal sinus rhythm Left axis deviation Incomplete right bundle branch block No previous ECGs available Confirmed by Julien Nordmann [phone]) on 12/31/2022 4:16:13 PM    PMH:   has a past medical history of Colon polyps, Complication of anesthesia, Hypercholesteremia, Interstitial cystitis, Osteopenia, Osteoporosis, Osteoporosis, PONV (postoperative nausea and vomiting), Recurrent UTI, Vitamin B12 deficiency, and Vitamin D deficiency.  PSH:    Past Surgical History:  Procedure Laterality Date   ABDOMINAL HYSTERECTOMY     CESAREAN SECTION     COLONOSCOPY WITH PROPOFOL N/A 10/21/2020   Procedure: COLONOSCOPY WITH PROPOFOL;   Surgeon: Regis Bill, MD;  Location: [facility] ENDOSCOPY;  Service: Endoscopy;  Laterality: N/A;   COLONOSCOPY WITH PROPOFOL N/A 04/20/2021   Procedure: COLONOSCOPY WITH PROPOFOL;  Surgeon: Regis Bill, MD;  Location: [facility] ENDOSCOPY;  Service: Endoscopy;  Laterality: N/A;   OOPHORECTOMY     TUBAL LIGATION      Current Outpatient Medications  Medication Sig Dispense Refill   atorvastatin (LIPITOR) 10 MG tablet Take 10 mg by mouth daily.     CRANBERRY EXTRACT PO Take by mouth daily at 8 pm.     estradiol (ESTRACE) 0.1 MG/GM vaginal cream Place vaginally.     fosfomycin (MONUROL) 3 g PACK Prn UTI     Multiple Vitamin (MULTIVITAMIN) tablet Take 1 tablet by mouth daily.     nitrofurantoin, macrocrystal-monohydrate, (MACROBID) 100 MG capsule Take 100 mg by mouth 2 (two) times daily.     No current facility-administered medications for this visit.    Allergies:   Amoxicillin   Social History:  The patient  reports that she has never smoked. She has never used smokeless tobacco. She reports that she does not currently use alcohol. She reports that she does not use drugs.   Family History:   family history includes Atrial fibrillation in her mother; Heart failure in her mother.    Review of Systems: Review of Systems  Constitutional: Negative.   HENT: Negative.    Respiratory: Negative.    Cardiovascular: Negative.   Gastrointestinal: Negative.   Musculoskeletal: Negative.   Neurological: Negative.   Psychiatric/Behavioral: Negative.    All other systems reviewed and are negative.   PHYSICAL EXAM: VS:  BP 120/88 (BP  Location: Right Arm, Patient Position: Sitting, Cuff Size: Normal)   Pulse 77   Ht 5\' 4"  (1.626 m)   Wt 143 lb 3.2 oz (65 kg)   SpO2 99%   BMI 24.58 kg/m  , BMI Body mass index is 24.58 kg/m. GEN: Well nourished, well developed, in no acute distress HEENT: normal Neck: no JVD, carotid bruits, or masses Cardiac: RRR; no murmurs, rubs, or gallops,no edema   Respiratory:  clear to auscultation bilaterally, normal work of breathing GI: soft, nontender, nondistended, + BS MS: no deformity or atrophy Skin: warm and dry, no rash Neuro:  Strength and sensation are intact Psych: euthymic mood, full affect  Recent Labs: No results found for requested labs within last 365 days.    Lipid Panel No results found for: "CHOL", "HDL", "LDLCALC", "TRIG"    Wt Readings from Last 3 Encounters:  12/31/22 143 lb 3.2 oz (65 kg)  04/20/21 137 lb (62.1 kg)  10/21/20 135 lb 4.4 oz (61.4 kg)     ASSESSMENT AND PLAN:  Problem List Items Addressed This Visit   None Visit Diagnoses     Tachycardia    -  Primary   Relevant Orders   EKG 12-Lead (Completed)      Palpitations/tachycardia Suspect PACs or PVCs, possibly exacerbated by stress in May 2024 though continues to have episodic symptoms Symptoms seem to have improved over the past several months Zio monitor ordered for further evaluation Discussed beta-blockers could be used for worsening symptoms  Hyperlipidemia Cholesterol relatively well-controlled on low-dose statin Discussed screening study with CT coronary calcium scoring if she desires Information provided, she will call if she would like [place] to order this    Total encounter time more than 50 minutes  Greater than 50% was spent in counseling and coordination of care with the patient    Signed, Dossie Arbour, M.D., Ph.D. [facility] [place], [place] [phone]

## 2022-12-31 ENCOUNTER — Ambulatory Visit: Payer: Medicare Other | Attending: Cardiovascular Disease

## 2022-12-31 ENCOUNTER — Encounter: Payer: Self-pay | Admitting: Cardiovascular Disease

## 2022-12-31 ENCOUNTER — Ambulatory Visit: Payer: Medicare Other | Admitting: Cardiovascular Disease

## 2022-12-31 VITALS — BP 120/88 | HR 77 | Ht 64.0 in | Wt 143.2 lb

## 2022-12-31 DIAGNOSIS — R000 Tachycardia, unspecified: Secondary | ICD-10-CM

## 2022-12-31 DIAGNOSIS — N3010 Interstitial cystitis (chronic) without hematuria: Secondary | ICD-10-CM | POA: Insufficient documentation

## 2022-12-31 DIAGNOSIS — E782 Mixed hyperlipidemia: Secondary | ICD-10-CM

## 2022-12-31 DIAGNOSIS — R002 Palpitations: Secondary | ICD-10-CM

## 2022-12-31 NOTE — Patient Instructions (Addendum)
Research PVC beats  Medication Instructions:  No changes  If you need a refill on your cardiac medications before your next appointment, please call your pharmacy.   Lab work: No new labs needed  Testing/Procedures:  Read about CT coronary calcium scoring   ZIO XT- Long Term Monitor Instructions  Your physician has requested you wear a ZIO patch monitor for 14 days.  This is a single patch monitor. [employer] supplies one patch monitor per enrollment. Additional stickers are not available. Please do not apply patch if you will be having a Nuclear Stress Test,  Echocardiogram, Cardiac CT, MRI, or Chest Xray during the period you would be wearing the  monitor. The patch cannot be worn during these tests. You cannot remove and re-apply the  ZIO XT patch monitor.  Your ZIO patch monitor will be mailed 3 day USPS to your address on file. It may take 3-5 days  to receive your monitor after you have been enrolled.  Once you have received your monitor, please review the enclosed instructions. Your monitor  has already been registered assigning a specific monitor serial # to you.  Billing and Patient Assistance Program Information  We have supplied [employer] with any of your insurance information on file for billing purposes. [employer] offers a sliding scale Patient Assistance Program for patients that do not have  insurance, or whose insurance does not completely cover the cost of the ZIO monitor.  You must apply for the Patient Assistance Program to qualify for this discounted rate.  To apply, please call [employer] at [phone], select option 4, select option 2, ask to apply for  Patient Assistance Program. Meredeth Ide will ask your household income, and how many people  are in your household. They will quote your out-of-pocket cost based on that information.  [employer] will also be able to set up a 70-month, interest-free payment plan if needed.  Applying the monitor   Shave hair from upper  left chest.  Hold abrader disc by orange tab. Rub abrader in 40 strokes over the upper left chest as  indicated in your monitor instructions.  Clean area with 4 enclosed alcohol pads. Let dry.  Apply patch as indicated in monitor instructions. Patch will be placed under collarbone on left  side of chest with arrow pointing upward.  Rub patch adhesive wings for 2 minutes. Remove white label marked "1". Remove the white  label marked "2". Rub patch adhesive wings for 2 additional minutes.  While looking in a mirror, press and release button in center of patch. A small green light will  flash 3-4 times. This will be your only indicator that the monitor has been turned on.  Do not shower for the first 24 hours. You may shower after the first 24 hours.  Press the button if you feel a symptom. You will hear a small click. Record Date, Time and  Symptom in the Patient Logbook.  When you are ready to remove the patch, follow instructions on the last 2 pages of Patient  Logbook. Stick patch monitor onto the last page of Patient Logbook.  Place Patient Logbook in the blue and white box. Use locking tab on box and tape box closed  securely. The blue and white box has prepaid postage on it. Please place it in the mailbox as  soon as possible. Your physician should have your test results approximately 7 days after the  monitor has been mailed back to [facility]  Call [facility] Customer Care at  [phone] if you have questions regarding  your ZIO XT patch monitor. Call them immediately if you see an orange light blinking on your  monitor.  If your monitor falls off in less than 4 days, contact our Monitor department at [phone].  If your monitor becomes loose or falls off after 4 days call [employer] at [phone] for  suggestions on securing your monitor   Follow-Up: At [facility], you and your health needs are our priority.  As part of our continuing mission to provide you with  exceptional heart care, we have created designated Provider Care Teams.  These Care Teams include your primary Cardiologist (physician) and Advanced Practice Providers (APPs -  Physician Assistants and Nurse Practitioners) who all work together to provide you with the care you need, when you need it.  You will need a follow up appointment as needed  Providers on your designated Care Team:   Nicolasa Ducking, NP Eula Listen, PA-C Cadence Fransico Michael, [place]  COVID-19 Vaccine Information can be found at: [email or web address] For questions related to vaccine distribution or appointments, please email [email or web address] or call [phone].

## 2023-01-05 DIAGNOSIS — R000 Tachycardia, unspecified: Secondary | ICD-10-CM

## 2023-06-23 ENCOUNTER — Other Ambulatory Visit: Payer: Self-pay | Admitting: Student

## 2023-06-23 DIAGNOSIS — Z1231 Encounter for screening mammogram for malignant neoplasm of breast: Secondary | ICD-10-CM

## 2023-06-29 ENCOUNTER — Ambulatory Visit
Admission: RE | Admit: 2023-06-29 | Discharge: 2023-06-29 | Disposition: A | Discharge status: Home / Self Care | Payer: Medicare Other | Source: Ambulatory Visit | Attending: Student | Admitting: Student

## 2023-06-29 DIAGNOSIS — Z1231 Encounter for screening mammogram for malignant neoplasm of breast: Secondary | ICD-10-CM | POA: Diagnosis present

## 2023-07-01 ENCOUNTER — Encounter: Payer: Self-pay | Admitting: Student

## 2023-07-05 ENCOUNTER — Other Ambulatory Visit: Payer: Self-pay | Admitting: Family Medicine

## 2023-07-05 DIAGNOSIS — R928 Other abnormal and inconclusive findings on diagnostic imaging of breast: Secondary | ICD-10-CM

## 2023-07-06 ENCOUNTER — Ambulatory Visit
Admission: RE | Admit: 2023-07-06 | Discharge: 2023-07-06 | Disposition: A | Discharge status: Home / Self Care | Payer: Medicare Other | Source: Ambulatory Visit | Attending: Family Medicine | Admitting: Family Medicine

## 2023-07-06 DIAGNOSIS — R928 Other abnormal and inconclusive findings on diagnostic imaging of breast: Secondary | ICD-10-CM

## 2023-07-11 ENCOUNTER — Other Ambulatory Visit: Payer: Medicare Other

## 2023-11-15 IMAGING — MG MM DIGITAL DIAGNOSTIC UNILAT*L* W/ TOMO W/ CAD
6 series · 6 of 18 positions shown · non-contrast
Comparison: Previous exam(s).

CLINICAL DATA: Patient was recalled from screening mammogram for a
possible mass and possible distortion in the left breast.

EXAM:
DIGITAL DIAGNOSTIC UNILATERAL LEFT MAMMOGRAM WITH TOMOSYNTHESIS AND
CAD; ULTRASOUND LEFT BREAST LIMITED
TECHNIQUE: Left digital diagnostic mammography and breast tomosynthesis was
performed. The images were evaluated with computer-aided detection.;
Targeted ultrasound examination of the left breast was performed.

[L ML synth-2D]
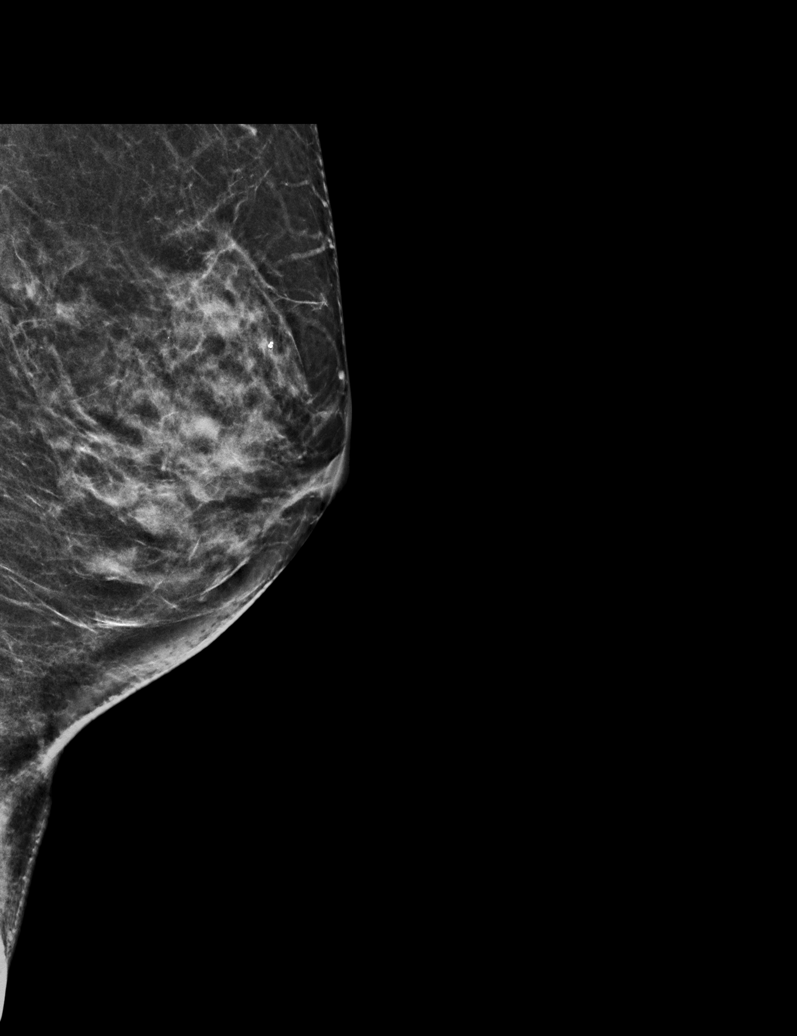

[L MLO synth-2D]
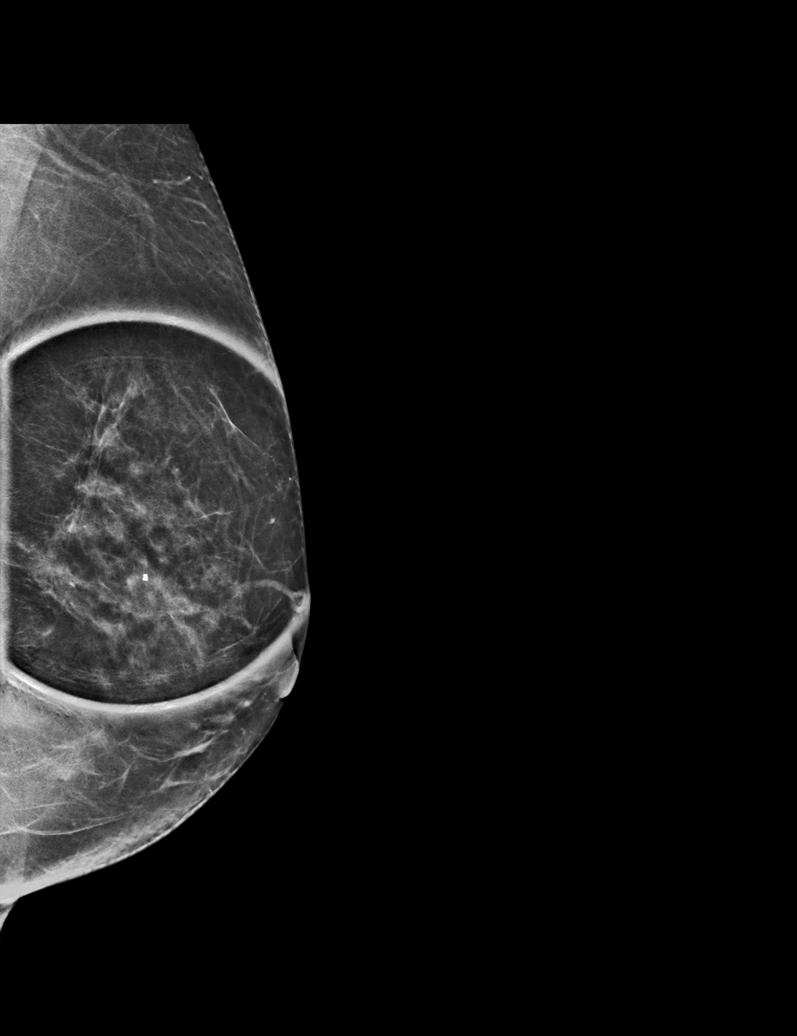

[L CC synth-2D]
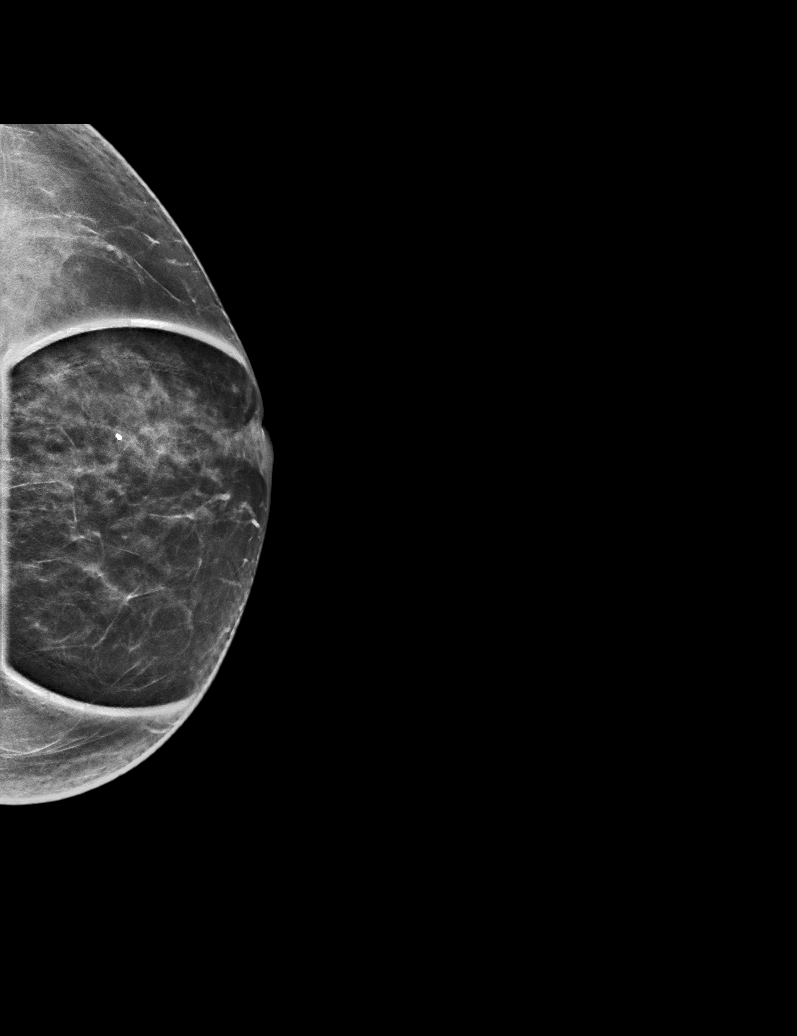

[L ML tomo · tomo slice 29/58.0]
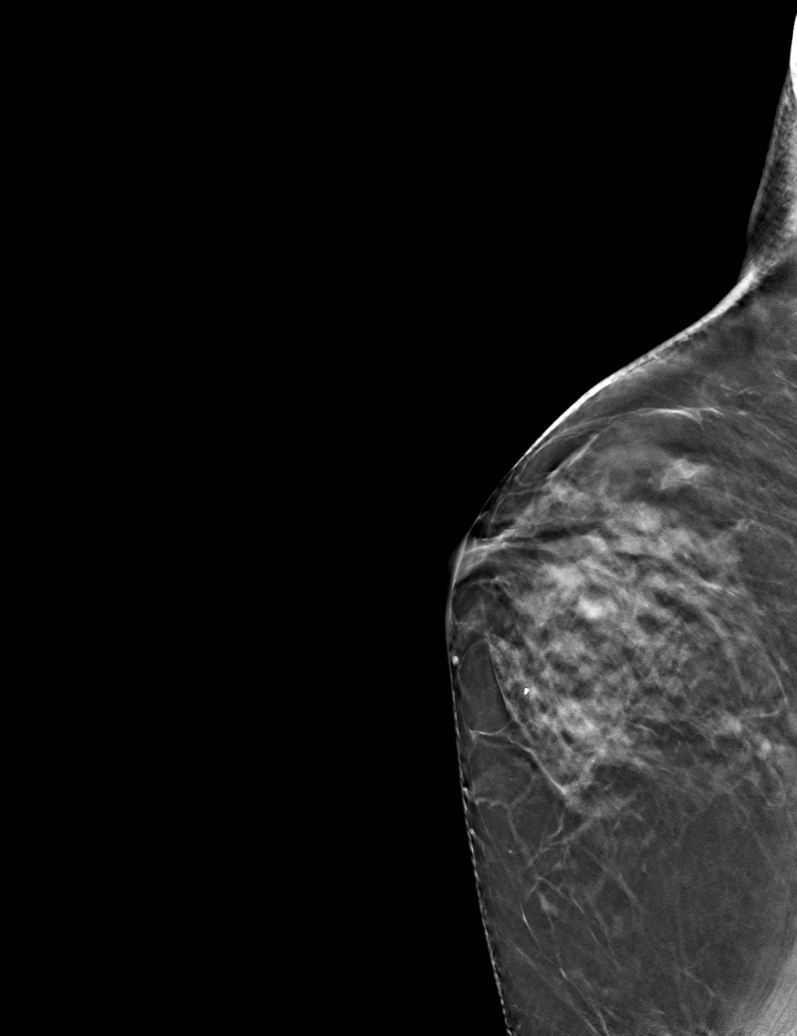

[L MLO tomo · tomo slice 31/60.0]
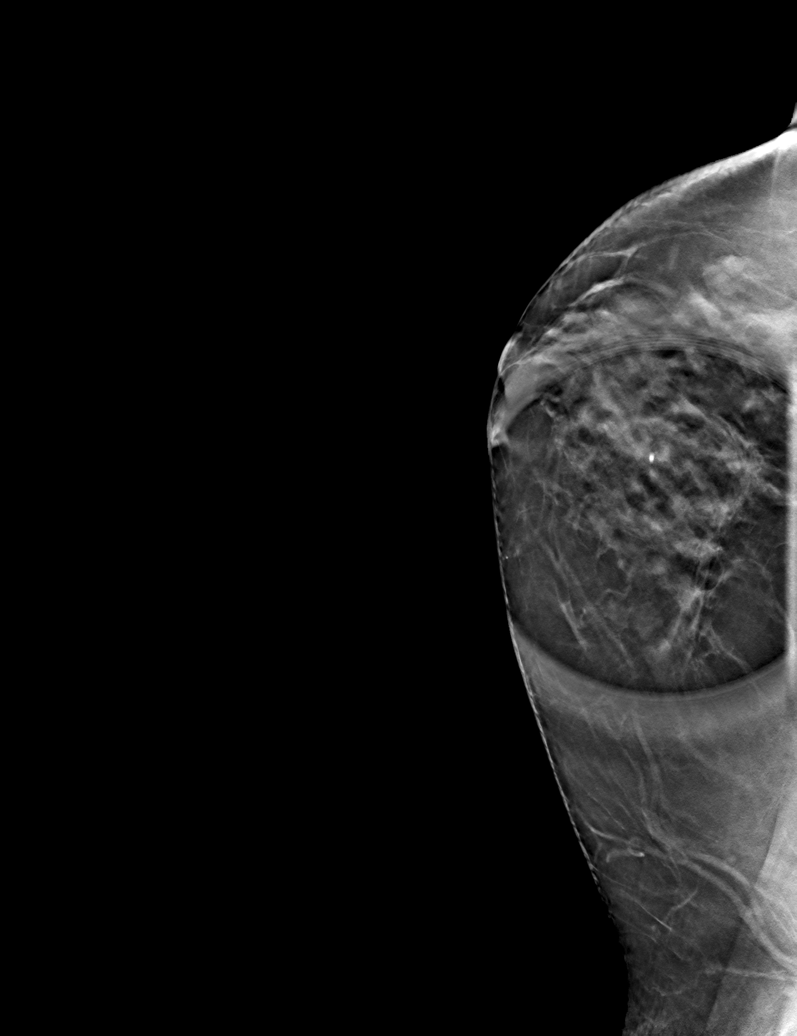

[L CC tomo · tomo slice 29/56.0]
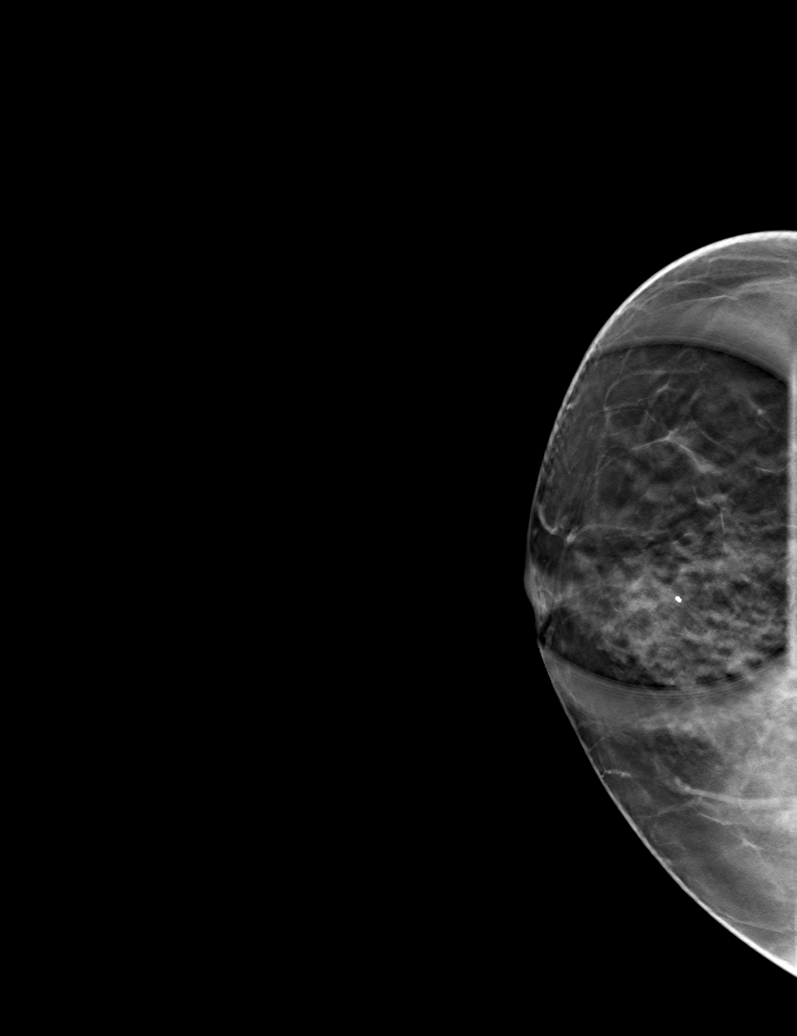

[6 of 18 positions shown; findings below may reference images not displayed]

ACR Breast Density Category c: The breast tissue is heterogeneously
dense, which may obscure small masses.
FINDINGS: Additional imaging of the left breast was performed. There is
persistence of a mass in the medial aspect of the breast seen on the
cc view. No persistent distortion is seen in the breast. There are
no malignant type microcalcifications.

Targeted ultrasound is performed, showing probable benign cluster of
cysts (apocrine metaplasia) in the left breast at 8 o'clock 3 cm
from the nipple measuring 9 x 6 x 4 mm.
IMPRESSION: Probable benign apocrine metaplasia in the 8 o'clock region of the
left breast.

RECOMMENDATION:
Short-term interval follow-up left breast ultrasound in 6 months is
recommended.

I have discussed the findings and recommendations with the patient.
If applicable, a reminder letter will be sent to the patient
regarding the next appointment.

BI-RADS CATEGORY  3: Probably benign.

## 2024-05-08 ENCOUNTER — Encounter: Payer: Self-pay | Admitting: *Deleted

## 2024-05-21 ENCOUNTER — Ambulatory Visit: Admission: RE | Admit: 2024-05-21 | Source: Home / Self Care

## 2024-05-21 ENCOUNTER — Encounter: Admission: RE | Payer: Self-pay | Source: Home / Self Care

## 2024-05-21 SURGERY — COLONOSCOPY
Anesthesia: General

## 2024-06-19 ENCOUNTER — Ambulatory Visit: Admitting: Certified Registered"

## 2024-06-19 ENCOUNTER — Ambulatory Visit
Admission: RE | Admit: 2024-06-19 | Discharge: 2024-06-19 | Disposition: A | Attending: Gastroenterology | Admitting: Gastroenterology

## 2024-06-19 ENCOUNTER — Encounter: Admission: RE | Disposition: A | Payer: Self-pay | Source: Home / Self Care | Attending: Gastroenterology

## 2024-06-19 ENCOUNTER — Other Ambulatory Visit: Payer: Self-pay

## 2024-06-19 DIAGNOSIS — K5730 Diverticulosis of large intestine without perforation or abscess without bleeding: Secondary | ICD-10-CM | POA: Insufficient documentation

## 2024-06-19 DIAGNOSIS — D120 Benign neoplasm of cecum: Secondary | ICD-10-CM | POA: Insufficient documentation

## 2024-06-19 DIAGNOSIS — Z90710 Acquired absence of both cervix and uterus: Secondary | ICD-10-CM | POA: Insufficient documentation

## 2024-06-19 DIAGNOSIS — E785 Hyperlipidemia, unspecified: Secondary | ICD-10-CM | POA: Insufficient documentation

## 2024-06-19 DIAGNOSIS — Z1211 Encounter for screening for malignant neoplasm of colon: Secondary | ICD-10-CM | POA: Insufficient documentation

## 2024-06-19 DIAGNOSIS — Q438 Other specified congenital malformations of intestine: Secondary | ICD-10-CM | POA: Insufficient documentation

## 2024-06-19 MED ORDER — PROPOFOL 500 MG/50ML IV EMUL
INTRAVENOUS | Status: DC | PRN
  Administered 2024-06-19: 165 ug/kg/min via INTRAVENOUS

## 2024-06-19 MED ORDER — SODIUM CHLORIDE 0.9 % IV SOLN: INTRAVENOUS | Status: DC

## 2024-06-19 MED ORDER — PROPOFOL 10 MG/ML IV BOLUS
INTRAVENOUS | Status: DC | PRN
  Administered 2024-06-19: 50 mg via INTRAVENOUS
  Administered 2024-06-19: 10 mg via INTRAVENOUS
  Administered 2024-06-19 (×2): 20 mg via INTRAVENOUS

## 2024-06-19 MED ORDER — LIDOCAINE HCL (CARDIAC) PF 100 MG/5ML IV SOSY
PREFILLED_SYRINGE | INTRAVENOUS | Status: DC | PRN
  Administered 2024-06-19: 100 mg via INTRAVENOUS

## 2024-06-19 MED ORDER — GLYCOPYRROLATE 0.2 MG/ML IJ SOLN
INTRAMUSCULAR | Status: DC | PRN
  Administered 2024-06-19: .2 mg via INTRAVENOUS

## 2024-06-19 MED ORDER — DEXMEDETOMIDINE HCL IN NACL 200 MCG/50ML IV SOLN
INTRAVENOUS | Status: DC | PRN
  Administered 2024-06-19: 12 ug via INTRAVENOUS

## 2024-06-19 NOTE — Interval H&P Note (Signed)
 History and Physical Interval Note:  06/19/2024 8:39 AM  Michele Chen  has presented today for surgery, with the diagnosis of hx of adenomatous polyp of colon.  The various methods of treatment have been discussed with the patient and family. After consideration of risks, benefits and other options for treatment, the patient has consented to  Procedures: COLONOSCOPY (N/A) as a surgical intervention.  The patient's history has been reviewed, patient examined, no change in status, stable for surgery.  I have reviewed the patient's chart and labs.  Questions were answered to the patient's satisfaction.     Ole ONEIDA Schick  Ok to proceed with colonoscopy

## 2024-06-19 NOTE — Anesthesia Preprocedure Evaluation (Signed)
"                                    Anesthesia Evaluation  Patient identified by MRN, date of birth, ID band Patient awake    Reviewed: Allergy & Precautions, H&P , NPO status , Patient's Chart, lab work & pertinent test results, reviewed documented beta blocker date and time   History of Anesthesia Complications (+) PONV and history of anesthetic complications  Airway Mallampati: II   Neck ROM: full    Dental  (+) Dental Advidsory Given, Teeth Intact   Pulmonary neg pulmonary ROS   Pulmonary exam normal        Cardiovascular Exercise Tolerance: Good negative cardio ROS Normal cardiovascular exam Rhythm:regular Rate:Normal     Neuro/Psych negative neurological ROS  negative psych ROS   GI/Hepatic negative GI ROS, Neg liver ROS,,,  Endo/Other  negative endocrine ROS    Renal/GU negative Renal ROS  negative genitourinary   Musculoskeletal   Abdominal   Peds  Hematology negative hematology ROS (+)   Anesthesia Other Findings Past Medical History: No date: Colon polyps No date: Complication of anesthesia No date: Hypercholesteremia No date: Interstitial cystitis No date: Osteopenia No date: Osteoporosis No date: Osteoporosis No date: PONV (postoperative nausea and vomiting) No date: Recurrent UTI No date: Vitamin B12 deficiency No date: Vitamin D deficiency Past Surgical History: No date: ABDOMINAL HYSTERECTOMY No date: CESAREAN SECTION 10/21/2020: COLONOSCOPY WITH PROPOFOL ; N/A     Comment:  Procedure: COLONOSCOPY WITH PROPOFOL ;  Surgeon:               Maryruth Ole DASEN, MD;  Location: [facility] ENDOSCOPY;                Service: Endoscopy;  Laterality: N/A; No date: OOPHORECTOMY No date: TUBAL LIGATION BMI    Body Mass Index: 23.52 kg/m     Reproductive/Obstetrics negative OB ROS                              Anesthesia Physical Anesthesia Plan  ASA: 2  Anesthesia Plan: General   Post-op Pain Management:     Induction: Intravenous  PONV Risk Score and Plan: 4 or greater and TIVA  Airway Management Planned: Natural Airway and Nasal Cannula  Additional Equipment:   Intra-op Plan:   Post-operative Plan:   Informed Consent: I have reviewed the patients History and Physical, chart, labs and discussed the procedure including the risks, benefits and alternatives for the proposed anesthesia with the patient or authorized representative who has indicated his/her understanding and acceptance.     Dental Advisory Given  Plan Discussed with: CRNA  Anesthesia Plan Comments:          Anesthesia Quick Evaluation  "

## 2024-06-19 NOTE — Transfer of Care (Signed)
 Immediate Anesthesia Transfer of Care Note  Patient: Michele Chen  Procedure(s) Performed: COLONOSCOPY POLYPECTOMY, INTESTINE  Patient Location: Endoscopy Unit  Anesthesia Type:General  Level of Consciousness: drowsy and patient cooperative  Airway & Oxygen Therapy: Patient Spontanous Breathing and Patient connected to face mask oxygen  Post-op Assessment: Report given to RN and Post -op Vital signs reviewed and stable  Post vital signs: Reviewed and stable  Last Vitals:  Vitals Value Taken Time  BP 105/66 06/19/24 09:18  Temp    Pulse 80 06/19/24 09:22  Resp 20 06/19/24 09:22  SpO2 98 % 06/19/24 09:22  Vitals shown include unfiled device data.  Last Pain:  Vitals:   06/19/24 0826  TempSrc: Temporal  PainSc: 0-No pain         Complications: No notable events documented.

## 2024-06-19 NOTE — H&P (Signed)
 Outpatient short stay form Pre-procedure 06/19/2024  Michele Michele Schick, MD  Primary Physician: Franchot Houston, PA-C  Reason for visit:  Surveillance  History of present illness:    69 y/o lady with history of HLD and hysterectomy here for surveillance colonoscopy. Last colonoscopy in 2022 with adenomatous polyps. No blood thinners. No family history of GI malignancies.   Current Medications[1]  Medications Prior to Admission  Medication Sig Dispense Refill Last Dose/Taking   atorvastatin (LIPITOR) 10 MG tablet Take 10 mg by mouth daily.   06/18/2024   CRANBERRY EXTRACT PO Take by mouth daily at 8 pm.   06/17/2024   estradiol (ESTRACE) 0.1 MG/GM vaginal cream Place vaginally.      fosfomycin (MONUROL) 3 g PACK Prn UTI      Multiple Vitamin (MULTIVITAMIN) tablet Take 1 tablet by mouth daily.   06/17/2024   nitrofurantoin, macrocrystal-monohydrate, (MACROBID) 100 MG capsule Take 100 mg by mouth 2 (two) times daily. (Patient not taking: Reported on 06/19/2024)   Not Taking     Allergies[2]   Past Medical History:  Diagnosis Date   Colon polyps    Complication of anesthesia    Hypercholesteremia    Interstitial cystitis    Osteopenia    Osteoporosis    Osteoporosis    PONV (postoperative nausea and vomiting)    Recurrent UTI    Vitamin B12 deficiency    Vitamin D deficiency     Review of systems:  Otherwise negative.    Physical Exam  Gen: Alert, oriented. Appears stated age.  HEENT: PERRLA. Lungs: No respiratory distress CV: RRR Abd: soft, benign, no masses Ext: No edema    Planned procedures: Proceed with colonoscopy. The patient understands the nature of the planned procedure, indications, risks, alternatives and potential complications including but not limited to bleeding, infection, perforation, damage to internal organs and possible oversedation/side effects from anesthesia. The patient agrees and gives consent to proceed.  Please refer to procedure notes for  findings, recommendations and patient disposition/instructions.     Michele Michele Schick, MD [name]         [1]  Current Facility-Administered Medications:    0.9 %  sodium chloride  infusion, , Intravenous, Continuous, Alexxus Sobh, Michele ONEIDA, MD, Last Rate: 20 mL/hr at 06/19/24 9166, Continued from Pre-op at 06/19/24 0833 [2]  Allergies Allergen Reactions   Amoxicillin Hives

## 2024-06-20 LAB — SURGICAL PATHOLOGY
